# Patient Record
Sex: Male | Born: 1980 | Hispanic: No | Marital: Married | Smoking: Never smoker
Health system: Southern US, Community
[De-identification: ages and names within clinical notes are randomized; demographics above are authoritative.]

---

## 2016-08-25 ENCOUNTER — Ambulatory Visit (HOSPITAL_COMMUNITY)
Admission: EM | Admit: 2016-08-25 | Discharge: 2016-08-25 | Disposition: A | Discharge status: Home / Self Care | Payer: BC Managed Care – PPO | Attending: Family Medicine | Admitting: Family Medicine

## 2016-08-25 ENCOUNTER — Encounter (HOSPITAL_COMMUNITY): Payer: Self-pay | Admitting: Emergency Medicine

## 2016-08-25 DIAGNOSIS — B028 Zoster with other complications: Secondary | ICD-10-CM

## 2016-08-25 MED ORDER — VALACYCLOVIR HCL 1 G PO TABS: 1000.0000 mg | ORAL_TABLET | Freq: Two times a day (BID) | ORAL | 0 refills | Status: AC

## 2016-08-25 MED ORDER — GABAPENTIN 300 MG PO CAPS: ORAL_CAPSULE | ORAL | 0 refills | Status: AC

## 2016-08-25 NOTE — ED Provider Notes (Signed)
CSN: [id number]     Arrival date & time 08/25/16  1726 History   First MD Initiated Contact with Patient 08/25/16 1804     Chief Complaint  Patient presents with  . Rash   (Consider location/radiation/quality/duration/timing/severity/associated sxs/prior Treatment) The history is provided by the patient.  Rash  Location:  Torso Torso rash location:  L axilla Quality: blistering, painful and redness   Pain details:    Quality:  Tingling, shooting and itching   Severity:  Moderate   Onset quality:  Gradual   Duration:  3 days   Timing:  Constant   Progression:  Unchanged Severity:  Moderate Onset quality:  Gradual Duration:  3 days Timing:  Constant Progression:  Unchanged Chronicity:  New Relieved by:  Nothing Worsened by:  Contact Ineffective treatments:  OTC analgesics Associated symptoms: no fatigue, no fever, no headaches, no myalgias, no nausea, no shortness of breath, no URI and not vomiting     History reviewed. No pertinent past medical history. History reviewed. No pertinent surgical history. History reviewed. No pertinent family history. Social History  Substance Use Topics  . Smoking status: Never Smoker  . Smokeless tobacco: Never Used  . Alcohol use Not on file    Review of Systems  Constitutional: Negative for fatigue and fever.  HENT: Negative for congestion, sinus pain and sinus pressure.   Eyes: Negative for pain, redness and itching.  Respiratory: Negative for cough and shortness of breath.   Cardiovascular: Negative for chest pain and palpitations.  Gastrointestinal: Negative for nausea and vomiting.  Musculoskeletal: Negative for myalgias, neck pain and neck stiffness.  Skin: Positive for rash.  Neurological: Negative for light-headedness and headaches.    Allergies  Patient has no known allergies.  Home Medications   Prior to Admission medications   Medication Sig Start Date End Date Taking? Authorizing Provider  gabapentin (NEURONTIN)  300 MG capsule Use as directed 08/25/16   Dorena Bodo, NP  valACYclovir (VALTREX) 1000 MG tablet Take 1 tablet (1,000 mg total) by mouth 2 (two) times daily. 08/25/16 09/08/16  Dorena Bodo, NP   Meds Ordered and Administered this Visit  Medications - No data to display  BP 124/80 (BP Location: Left Arm)   Pulse 61   Temp 98.1 F (36.7 C) (Oral)   Resp 16   SpO2 94%  No data found.   Physical Exam  Constitutional: He is oriented to person, place, and time. He appears well-developed and well-nourished. No distress.  HENT:  Head: Normocephalic and atraumatic.  Right Ear: External ear normal.  Left Ear: External ear normal.  Eyes: EOM are normal. Pupils are equal, round, and reactive to light.  Cardiovascular: Normal rate and regular rhythm.   Pulmonary/Chest: Effort normal and breath sounds normal.  Neurological: He is alert and oriented to person, place, and time.  Skin: Skin is warm and dry. Capillary refill takes less than 2 seconds. Rash noted. He is not diaphoretic.  Erythemic, vesicular rash, dermatomal pattern left axillary area the T6-T7 dermatome.  Psychiatric: He has a normal mood and affect. His behavior is normal.  Nursing note and vitals reviewed.   Urgent Care Course     Procedures (including critical care time)  Labs Review Labs Reviewed - No data to display  Imaging Review No results found.      MDM   1. Herpes zoster with complication     Patient's signs, symptoms, and physical exam findings are consistent with an outbreak of shingles. Prescribed Valtrex, gabapentin  for pain, provided counseling and encouraged follow-up with his primary care provider or return to clinic if his symptoms persist past one week.    Dorena Bodo, NP 08/25/16 [phone]

## 2016-08-25 NOTE — ED Triage Notes (Signed)
Patient presents to [facility] with a rash on his left side and back of shoulder. Patient states that he has had the rash for about 3 days. Patient states that he does have some pain associated with the rash. Red, with slight raised bumps. No fever.

## 2016-08-25 NOTE — Discharge Instructions (Signed)
You have shingles. I have prescribed an antiviral called Valtrex, take 1 tablet twice a day for 1 week.  For pain, I have prescribed gabapentin, take 1 tablet at bed time the first night, then take 1 tablet twice a day the second day, on the third day, you may take 1 tablet three times a day. When you no longer need the medicine for pain, scale back in a similar manner over three days. You are contagious, wash your hands frequently.

## 2016-09-13 ENCOUNTER — Emergency Department (HOSPITAL_COMMUNITY)
Admission: EM | Admit: 2016-09-13 | Discharge: 2016-09-13 | Disposition: A | Discharge status: Home / Self Care | Payer: BC Managed Care – PPO | Attending: Emergency Medicine | Admitting: Emergency Medicine

## 2016-09-13 ENCOUNTER — Emergency Department (HOSPITAL_COMMUNITY): Payer: BC Managed Care – PPO

## 2016-09-13 DIAGNOSIS — N23 Unspecified renal colic: Secondary | ICD-10-CM

## 2016-09-13 DIAGNOSIS — Z79899 Other long term (current) drug therapy: Secondary | ICD-10-CM | POA: Insufficient documentation

## 2016-09-13 DIAGNOSIS — N201 Calculus of ureter: Secondary | ICD-10-CM | POA: Insufficient documentation

## 2016-09-13 DIAGNOSIS — R1031 Right lower quadrant pain: Secondary | ICD-10-CM | POA: Diagnosis present

## 2016-09-13 LAB — CBC WITH DIFFERENTIAL/PLATELET
BASOS ABS: 0 10*3/uL (ref 0.0–0.1)
BASOS PCT: 0 %
EOS ABS: 0.1 10*3/uL (ref 0.0–0.7)
EOS PCT: 1 %
HCT: 47.3 % (ref 39.0–52.0)
Hemoglobin: 16.1 g/dL (ref 13.0–17.0)
LYMPHS PCT: 11 %
Lymphs Abs: 1.1 10*3/uL (ref 0.7–4.0)
MCH: 30.3 pg (ref 26.0–34.0)
MCHC: 34 g/dL (ref 30.0–36.0)
MCV: 88.9 fL (ref 78.0–100.0)
MONO ABS: 0.2 10*3/uL (ref 0.1–1.0)
Monocytes Relative: 2 %
Neutro Abs: 8.4 10*3/uL — ABNORMAL HIGH (ref 1.7–7.7)
Neutrophils Relative %: 86 %
PLATELETS: 236 10*3/uL (ref 150–400)
RBC: 5.32 MIL/uL (ref 4.22–5.81)
RDW: 12.2 % (ref 11.5–15.5)
WBC: 9.9 10*3/uL (ref 4.0–10.5)

## 2016-09-13 LAB — COMPREHENSIVE METABOLIC PANEL
ALK PHOS: 65 U/L (ref 38–126)
ALT: 29 U/L (ref 17–63)
ANION GAP: 12 (ref 5–15)
AST: 24 U/L (ref 15–41)
Albumin: 5 g/dL (ref 3.5–5.0)
BILIRUBIN TOTAL: 1.1 mg/dL (ref 0.3–1.2)
BUN: 12 mg/dL (ref 6–20)
CALCIUM: 9.4 mg/dL (ref 8.9–10.3)
CO2: 24 mmol/L (ref 22–32)
Chloride: 102 mmol/L (ref 101–111)
Creatinine, Ser: 0.95 mg/dL (ref 0.61–1.24)
GFR calc Af Amer: >60 mL/min (ref >60)
GLUCOSE: 122 mg/dL — AB (ref 65–99)
POTASSIUM: 3.6 mmol/L (ref 3.5–5.1)
Sodium: 138 mmol/L (ref 135–145)
TOTAL PROTEIN: 7.8 g/dL (ref 6.5–8.1)

## 2016-09-13 MED ORDER — ONDANSETRON HCL 4 MG/2ML IJ SOLN
4.0000 mg | Freq: Four times a day (QID) | INTRAMUSCULAR | Status: DC | PRN
  Administered 2016-09-13: 4 mg via INTRAVENOUS
  Filled 2016-09-13: qty 2

## 2016-09-13 MED ORDER — HYDROCODONE-ACETAMINOPHEN 5-325 MG PO TABS: 1.0000 | ORAL_TABLET | Freq: Four times a day (QID) | ORAL | 0 refills | Status: AC | PRN

## 2016-09-13 MED ORDER — ONDANSETRON 8 MG PO TBDP: 8.0000 mg | ORAL_TABLET | Freq: Three times a day (TID) | ORAL | 0 refills | Status: AC | PRN

## 2016-09-13 MED ORDER — HYPROMELLOSE (GONIOSCOPIC) 2.5 % OP SOLN: 1.0000 [drp] | Freq: Three times a day (TID) | OPHTHALMIC | 0 refills | Status: AC | PRN

## 2016-09-13 MED ORDER — MORPHINE SULFATE (PF) 4 MG/ML IV SOLN
4.0000 mg | INTRAVENOUS | Status: DC | PRN
  Administered 2016-09-13: 4 mg via INTRAVENOUS
  Filled 2016-09-13: qty 1

## 2016-09-13 MED ORDER — LACTATED RINGERS IV SOLN
INTRAVENOUS | Status: DC
  Administered 2016-09-13: 10:00:00 via INTRAVENOUS

## 2016-09-13 MED ORDER — IBUPROFEN 600 MG PO TABS: 600.0000 mg | ORAL_TABLET | Freq: Four times a day (QID) | ORAL | 0 refills | Status: AC | PRN

## 2016-09-13 MED ORDER — IOPAMIDOL (ISOVUE-300) INJECTION 61%
INTRAVENOUS | Status: AC
  Administered 2016-09-13: 100 mL
  Filled 2016-09-13: qty 100

## 2016-09-13 MED ORDER — CETIRIZINE HCL 5 MG PO CHEW: 5.0000 mg | CHEWABLE_TABLET | Freq: Every day | ORAL | 1 refills | Status: AC

## 2016-09-13 NOTE — ED Provider Notes (Addendum)
[facility]-EMERGENCY DEPT Provider Note   CSN: [id number] Arrival date & time: 09/13/16  4098     History   Chief Complaint Chief Complaint  Patient presents with  . Abdominal Pain    HPI Jose Gill is a 36 y.o. male.  HPI Pt comes in with cc of RLQ abd pain. Pt reports that when he went to the bathroom this morning, he had a sudden onset severe abd pain, with nausea, diaphoresis. Pt also felt profound weakness. Pt has no hx of pain like this. He has hx of pneumothorax and recent diagnosis of shingles. NPO since 6 pm last night.  No past medical history on file.  There are no active problems to display for this patient.   No past surgical history on file.     Home Medications    Prior to Admission medications   Medication Sig Start Date End Date Taking? Authorizing Provider  gabapentin (NEURONTIN) 300 MG capsule Use as directed Patient taking differently: Take 300 mg by mouth daily as needed (pain). Use as directed 08/25/16  Yes Dorena Bodo, NP  Misc Natural Products (GINSENG-AMERICAN PO) Take 1 tablet by mouth daily.   Yes Historical Provider, MD  Multiple Vitamin (MULTIVITAMIN WITH MINERALS) TABS tablet Take 1 tablet by mouth daily.   Yes Historical Provider, MD  olopatadine (PATANOL) 0.1 % ophthalmic solution Place 1 drop into both eyes 2 (two) times daily as needed for allergies.   Yes Historical Provider, MD  cetirizine (ZYRTEC) 5 MG chewable tablet Chew 1 tablet (5 mg total) by mouth daily. 09/13/16   Derwood Kaplan, MD  HYDROcodone-acetaminophen (NORCO/VICODIN) 5-325 MG tablet Take 1 tablet by mouth every 6 (six) hours as needed. 09/13/16   Derwood Kaplan, MD  hydroxypropyl methylcellulose / hypromellose (ISOPTO TEARS / GONIOVISC) 2.5 % ophthalmic solution Place 1 drop into both eyes 3 (three) times daily as needed for dry eyes. 09/13/16   Derwood Kaplan, MD  ibuprofen (ADVIL,MOTRIN) 600 MG tablet Take 1 tablet (600 mg total) by mouth every 6 (six) hours as needed.  09/13/16   Derwood Kaplan, MD  ondansetron (ZOFRAN ODT) 8 MG disintegrating tablet Take 1 tablet (8 mg total) by mouth every 8 (eight) hours as needed for nausea. 09/13/16   Derwood Kaplan, MD    Family History No family history on file.  Social History Social History  Substance Use Topics  . Smoking status: Never Smoker  . Smokeless tobacco: Never Used  . Alcohol use Not on file     Allergies   Patient has no known allergies.   Review of Systems Review of Systems  Constitutional: Positive for diaphoresis.  Gastrointestinal: Positive for abdominal pain and nausea.  All other systems reviewed and are negative.    Physical Exam Updated Vital Signs BP 134/80   Pulse 84   Temp 97.8 F (36.6 C) (Oral)   Resp 14   SpO2 97%   Physical Exam  Constitutional: He is oriented to person, place, and time. He appears well-developed.  HENT:  Head: Normocephalic and atraumatic.  Eyes: Conjunctivae and EOM are normal. Pupils are equal, round, and reactive to light.  Neck: Normal range of motion. Neck supple.  Cardiovascular: Normal rate and regular rhythm.   Pulmonary/Chest: Effort normal and breath sounds normal.  Abdominal: Soft. Bowel sounds are normal. He exhibits no distension and no mass. There is tenderness. There is guarding. There is no rebound.  Pt has tenderness, and some guarding over the Right side of the abdomen, worst over  the RLQ  Musculoskeletal: He exhibits no deformity.  Neurological: He is alert and oriented to person, place, and time.  Skin: Skin is warm.  Nursing note and vitals reviewed.    ED Treatments / Results  Labs (all labs ordered are listed, but only abnormal results are displayed) Labs Reviewed  COMPREHENSIVE METABOLIC PANEL - Abnormal; Notable for the following:       Result Value   Glucose, Bld 122 (*)    All other components within normal limits  CBC WITH DIFFERENTIAL/PLATELET - Abnormal; Notable for the following:    Neutro Abs 8.4 (*)      All other components within normal limits    EKG  EKG Interpretation None       Radiology Ct Abdomen Pelvis W Contrast  Result Date: 09/13/2016 CLINICAL DATA:  Right lower quadrant pain EXAM: CT ABDOMEN AND PELVIS WITH CONTRAST TECHNIQUE: Multidetector CT imaging of the abdomen and pelvis was performed using the standard protocol following bolus administration of intravenous contrast. CONTRAST:  ISOVUE-300 IOPAMIDOL (ISOVUE-300) INJECTION 61% COMPARISON:  None. FINDINGS: Lower chest: Lung bases are clear. No effusions. Heart is normal size. Hepatobiliary: No focal hepatic abnormality. Gallbladder unremarkable. Pancreas: No focal abnormality or ductal dilatation. Spleen: No focal abnormality.  Normal size. Adrenals/Urinary Tract: 4 mm proximal right ureteral stone. Mild fullness of the right renal collecting system and perinephric stranding. No additional renal or ureteral stones. Adrenal glands and urinary bladder unremarkable. Stomach/Bowel: Normal appendix. Stomach, large and small bowel grossly unremarkable. Vascular/Lymphatic: No evidence of aneurysm or adenopathy. Reproductive: No visible abnormality. Other: No free fluid or free air. Musculoskeletal: No acute findings. IMPRESSION: 4 mm proximal right ureteral stone with mild right collecting system fullness and perinephric stranding. Electronically Signed   By: Charlett Nose M.D.   On: 09/13/2016 12:48    Procedures Procedures (including critical care time)  Medications Ordered in ED Medications  lactated ringers infusion ( Intravenous New Bag/Given 09/13/16 1028)  morphine 4 MG/ML injection 4 mg (4 mg Intravenous Given 09/13/16 1028)  ondansetron (ZOFRAN) injection 4 mg (4 mg Intravenous Given 09/13/16 1028)  iopamidol (ISOVUE-300) 61 % injection (100 mLs  Contrast Given 09/13/16 1224)     Initial Impression / Assessment and Plan / ED Course  I have reviewed the triage vital signs and the nursing notes.  Pertinent labs &  imaging results that were available during my care of the patient were reviewed by me and considered in my medical decision making (see chart for details).  Clinical Course as of Sep 13 1401  Fri Sep 13, 2016  1402 Results from the ER workup discussed with the patient face to face and all questions answered to the best of my ability.  Strict ER return precautions have been discussed, and patient is agreeing with the plan and is comfortable with the workup done and the recommendations from the ER.  CT Abdomen Pelvis W Contrast [AN]    Clinical Course User Index [AN] Derwood Kaplan, MD    Pt with right sided abd tenderness, with guarding. Concerns for appendicitis, perforated viscus. Ureteral colic also possible, while thrombosis considered less likely. CT ordered.   Final Clinical Impressions(s) / ED Diagnoses   Final diagnoses:  RLQ abdominal pain  Ureteral colic    New Prescriptions New Prescriptions   CETIRIZINE (ZYRTEC) 5 MG CHEWABLE TABLET    Chew 1 tablet (5 mg total) by mouth daily.   HYDROCODONE-ACETAMINOPHEN (NORCO/VICODIN) 5-325 MG TABLET    Take 1 tablet by  mouth every 6 (six) hours as needed.   HYDROXYPROPYL METHYLCELLULOSE / HYPROMELLOSE (ISOPTO TEARS / GONIOVISC) 2.5 % OPHTHALMIC SOLUTION    Place 1 drop into both eyes 3 (three) times daily as needed for dry eyes.   IBUPROFEN (ADVIL,MOTRIN) 600 MG TABLET    Take 1 tablet (600 mg total) by mouth every 6 (six) hours as needed.   ONDANSETRON (ZOFRAN ODT) 8 MG DISINTEGRATING TABLET    Take 1 tablet (8 mg total) by mouth every 8 (eight) hours as needed for nausea.     Derwood Kaplan, MD 09/13/16 4098    Derwood Kaplan, MD 09/13/16 1191

## 2016-09-13 NOTE — ED Notes (Signed)
Patient transported to CT 

## 2016-09-13 NOTE — ED Triage Notes (Addendum)
Pt states this am he had a sudden onset of RLQ pain with nausea. Pt denies any v/d. Pt states he was treated for shingles 2 weeks ago.

## 2016-09-13 NOTE — Discharge Instructions (Signed)
We saw you in the ER for the abdominal pain. °Our results indicate that you have a kidney stone. °We were able to get your pain is relative control, and we can safely send you home. ° °Take the meds prescribed. °Set up an appointment with the Urologist. °If the pain is unbearable, you start having fevers, chills, and are unable to keep any meds down - then return to the ER. °  °

## 2018-04-29 IMAGING — CT CT ABD-PELV W/ CM
2 of 5 series · 16 of 46 positions shown, 18 images · IV contrast (APPLIED)
Comparison: None.

CLINICAL DATA: Right lower quadrant pain

EXAM:
CT ABDOMEN AND PELVIS WITH CONTRAST
TECHNIQUE: Multidetector CT imaging of the abdomen and pelvis was performed
using the standard protocol following bolus administration of
intravenous contrast.
CONTRAST:  100mL 9LWTOX-SWW IOPAMIDOL (9LWTOX-SWW) INJECTION 61%

[Series 3: abd/ pelvis 5.0 i30f 2 · axial · 0.68mm/px · z∈[-620,-200]mm · 13 of 94 slices shown, 15 images]
[im 5/94  soft-tissue]
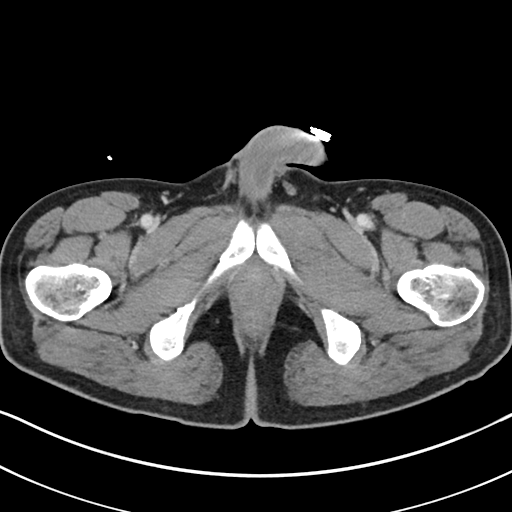
[im 5/94  bone]
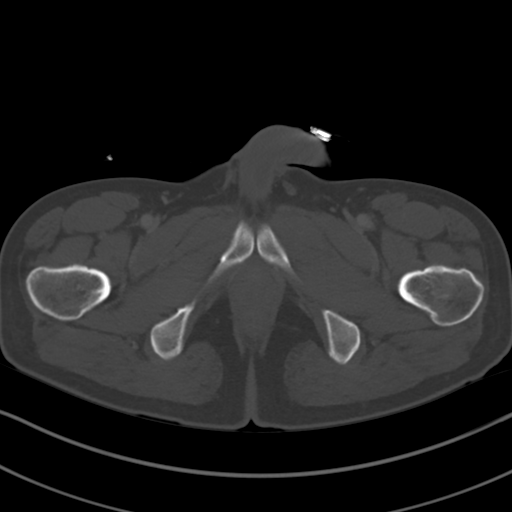
[im 14/94  soft-tissue]
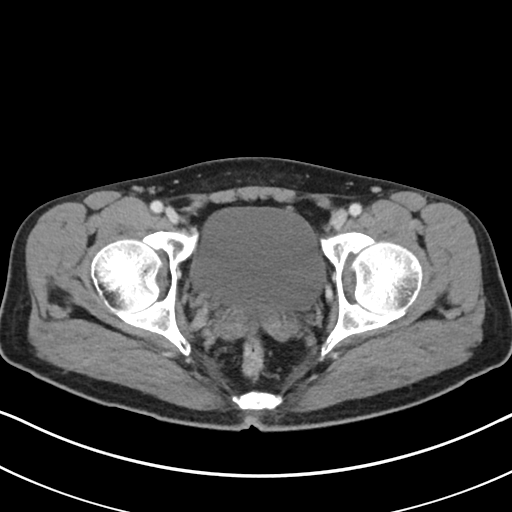
[im 19/94  soft-tissue]
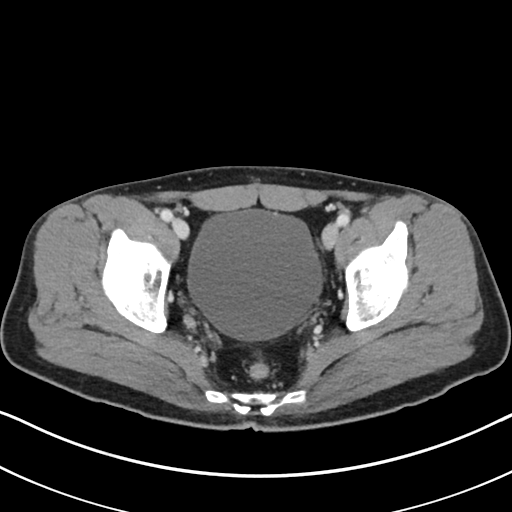
[im 28/94  soft-tissue]
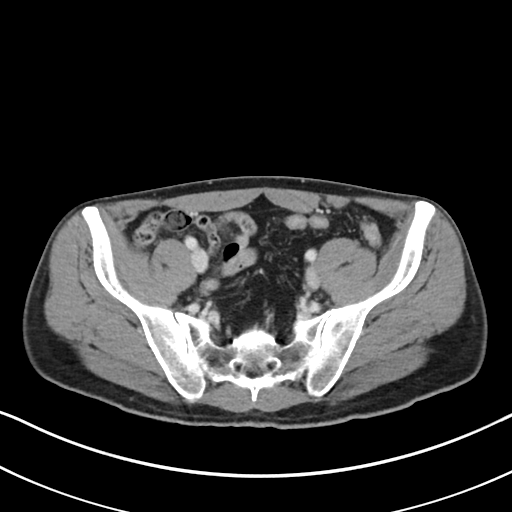
[im 33/94  soft-tissue]
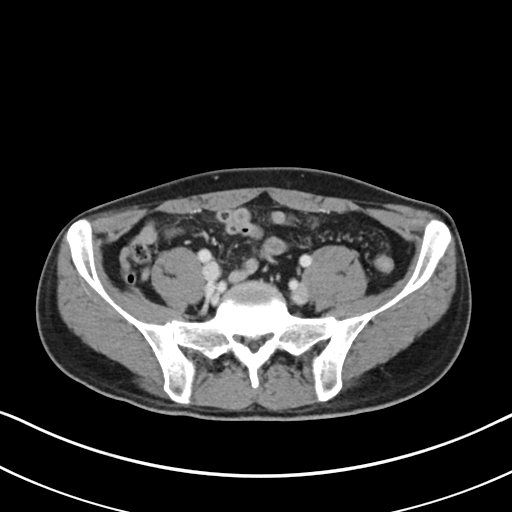
[im 42/94  soft-tissue]
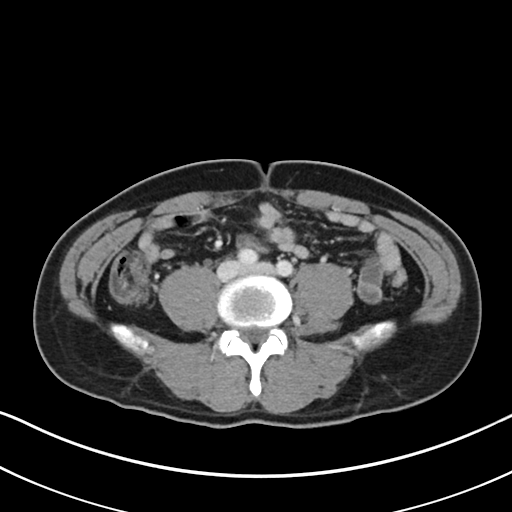
[im 47/94  soft-tissue]
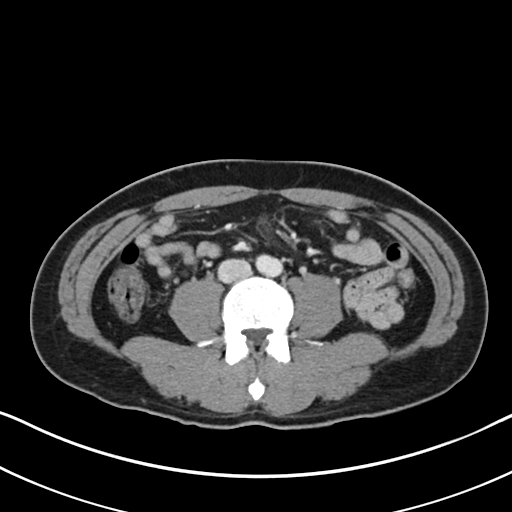
[im 52/94  soft-tissue]
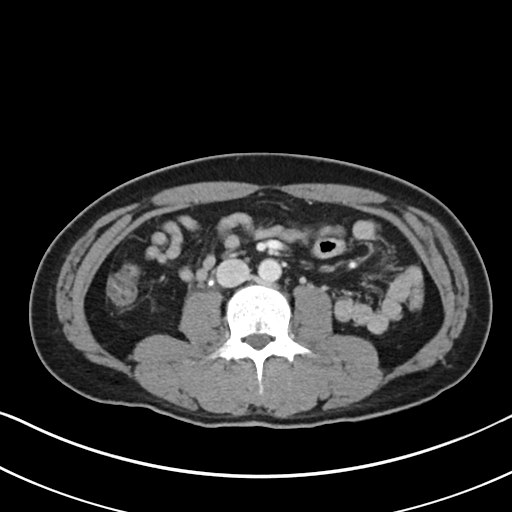
[im 61/94  soft-tissue]
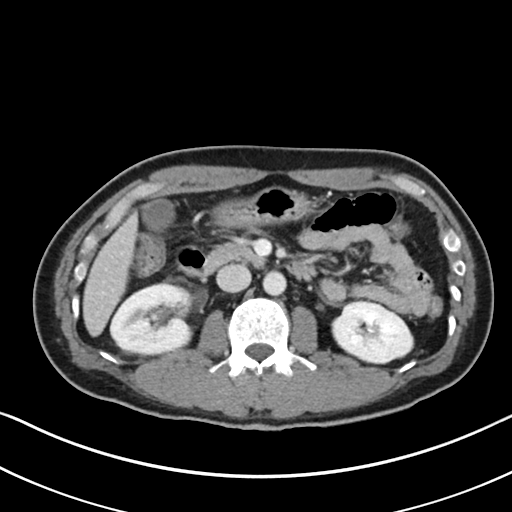
[im 61/94  bone]
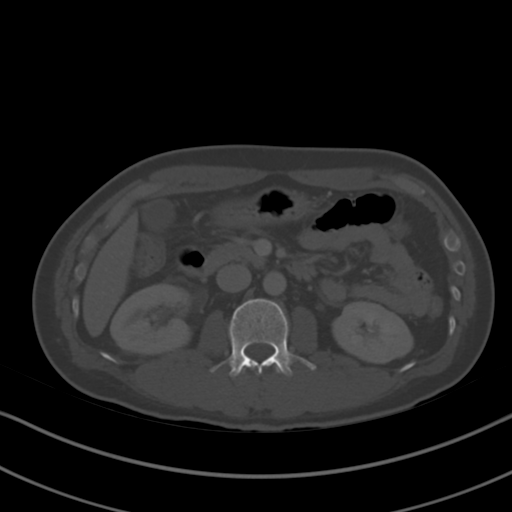
[im 66/94  soft-tissue]
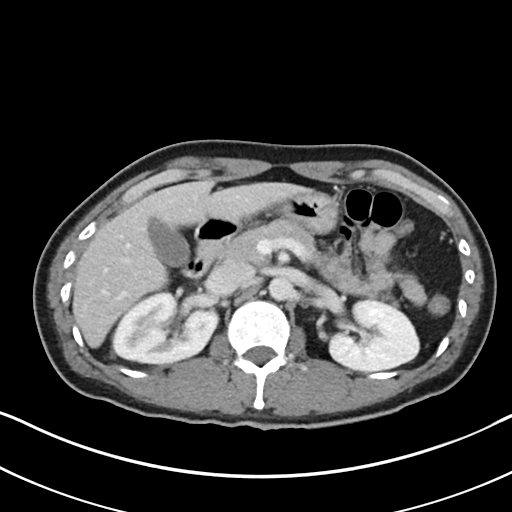
[im 75/94  soft-tissue]
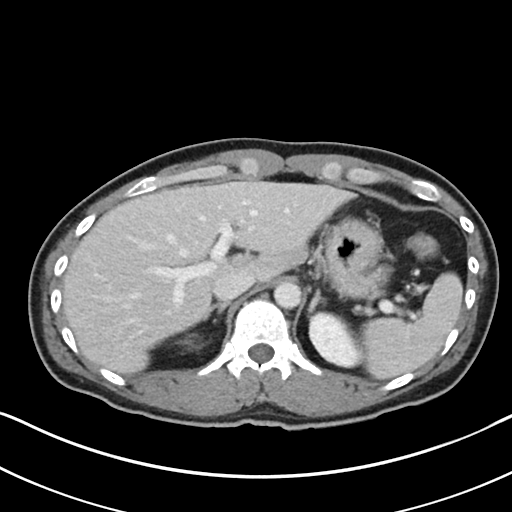
[im 80/94  soft-tissue]
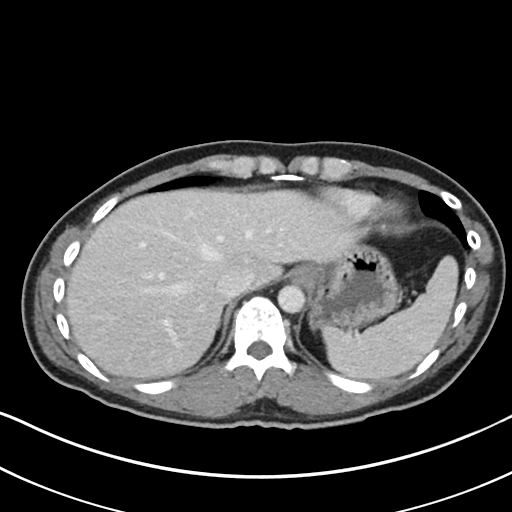
[im 89/94  soft-tissue]
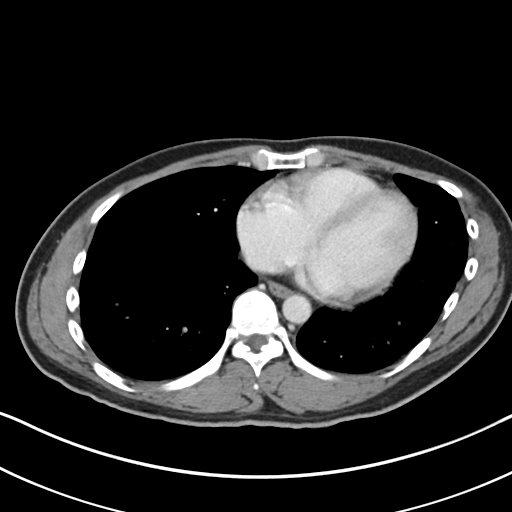

[Series 7: coronal soft tissue · coronal · 0.66mm/px · 3 of 75 slices shown]
[im 25/75  soft-tissue]
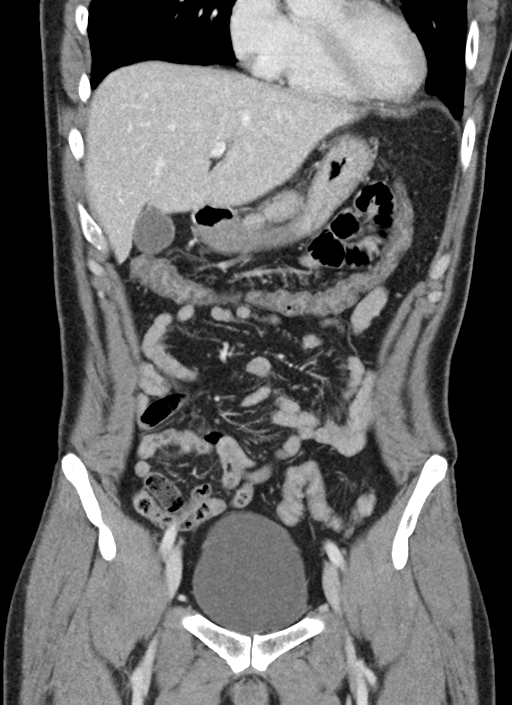
[im 33/75  soft-tissue]
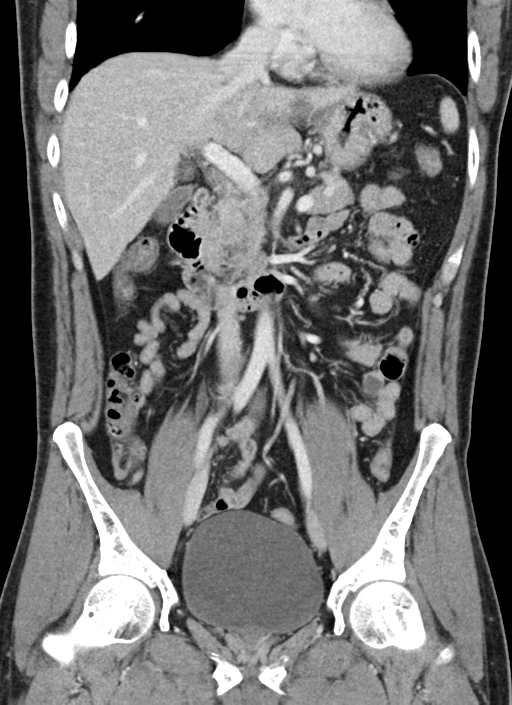
[im 42/75  soft-tissue]
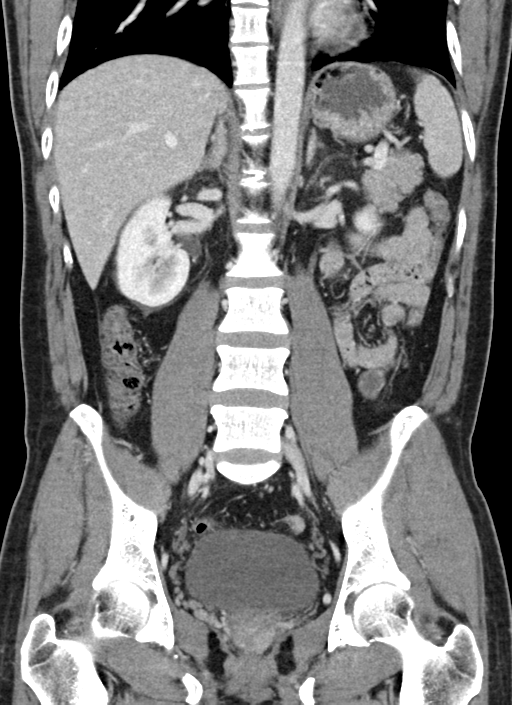

[16 of 46 positions shown; findings below may reference images not displayed]

FINDINGS: Lower chest: Lung bases are clear. No effusions. Heart is normal
size.

Hepatobiliary: No focal hepatic abnormality. Gallbladder
unremarkable.

Pancreas: No focal abnormality or ductal dilatation.

Spleen: No focal abnormality.  Normal size.

Adrenals/Urinary Tract: 4 mm proximal right ureteral stone. Mild
fullness of the right renal collecting system and perinephric
stranding. No additional renal or ureteral stones. Adrenal glands
and urinary bladder unremarkable.

Stomach/Bowel: Normal appendix. Stomach, large and small bowel
grossly unremarkable.

Vascular/Lymphatic: No evidence of aneurysm or adenopathy.

Reproductive: No visible abnormality.

Other: No free fluid or free air.

Musculoskeletal: No acute findings.
IMPRESSION: 4 mm proximal right ureteral stone with mild right collecting system
fullness and perinephric stranding.
